# Patient Record
Sex: Female | Born: 1980 | Race: White | Hispanic: No | Marital: Married | Smoking: Never smoker
Health system: Southern US, Community
[De-identification: ages and names within clinical notes are randomized; demographics above are authoritative.]

## PROBLEM LIST (undated history)

## (undated) ENCOUNTER — Emergency Department: Discharge status: Absent without leave | Payer: Self-pay

## (undated) DIAGNOSIS — F418 Other specified anxiety disorders: Secondary | ICD-10-CM

## (undated) HISTORY — PX: WISDOM TOOTH EXTRACTION: SHX21

---
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
## 2000-02-26 ENCOUNTER — Other Ambulatory Visit: Admission: RE | Admit: 2000-02-26 | Discharge: 2000-02-26 | Payer: Self-pay | Admitting: Gynecology

## 2000-07-12 ENCOUNTER — Other Ambulatory Visit: Admission: RE | Admit: 2000-07-12 | Discharge: 2000-07-12 | Payer: Self-pay | Admitting: Gynecology

## 2000-10-28 ENCOUNTER — Other Ambulatory Visit: Admission: RE | Admit: 2000-10-28 | Discharge: 2000-10-28 | Payer: Self-pay | Admitting: Gynecology

## 2001-02-10 ENCOUNTER — Other Ambulatory Visit: Admission: RE | Admit: 2001-02-10 | Discharge: 2001-02-10 | Payer: Self-pay | Admitting: Gynecology

## 2002-04-21 ENCOUNTER — Other Ambulatory Visit: Admission: RE | Admit: 2002-04-21 | Discharge: 2002-04-21 | Payer: Self-pay | Admitting: *Deleted

## 2002-05-24 ENCOUNTER — Encounter: Admission: RE | Admit: 2002-05-24 | Discharge: 2002-08-22 | Payer: Self-pay | Admitting: Gynecology

## 2003-06-22 ENCOUNTER — Other Ambulatory Visit: Admission: RE | Admit: 2003-06-22 | Discharge: 2003-06-22 | Payer: Self-pay | Admitting: Gynecology

## 2012-11-23 ENCOUNTER — Encounter: Payer: Self-pay | Admitting: *Deleted

## 2012-11-23 ENCOUNTER — Emergency Department (INDEPENDENT_AMBULATORY_CARE_PROVIDER_SITE_OTHER)
Admission: EM | Admit: 2012-11-23 | Discharge: 2012-11-23 | Disposition: A | Discharge status: Home / Self Care | Payer: BC Managed Care – PPO | Source: Home / Self Care | Attending: Family Medicine | Admitting: Family Medicine

## 2012-11-23 DIAGNOSIS — R002 Palpitations: Secondary | ICD-10-CM

## 2012-11-23 DIAGNOSIS — Z3202 Encounter for pregnancy test, result negative: Secondary | ICD-10-CM

## 2012-11-23 LAB — POCT CBC W AUTO DIFF (K'VILLE URGENT CARE)

## 2012-11-23 MED ORDER — CLONAZEPAM 0.5 MG PO TABS: 0.5000 mg | ORAL_TABLET | Freq: Two times a day (BID) | ORAL | Status: DC | PRN

## 2012-11-23 NOTE — ED Notes (Signed)
Patient c/o intermittent palpitations, increasing in frequency x 4 days. Denies CP, SOB, nausea, diaphoresis, stress, anxiety, back or jaw pain. Palpitation typically occur when standing, talking and moving.

## 2012-11-23 NOTE — ED Provider Notes (Signed)
History     CSN: [id number]  Arrival date & time 11/23/12  1642   First MD Initiated Contact with Patient 11/23/12 1643      Chief Complaint  Patient presents with  . Palpitations    HPI Pt presents with palpitations x 4 days.  Pt states that she has had a prior history of palpitations in the past. However, palpitations have been more persistent over the past 4 days.  No associated CP or SOB.  No alleviating or aggravating factors associated with palpitations.  Pt denies any caffeine intake.  No prior hx/o anemia or thyroid problems.  Pt does report a prior hx/o anxiety, being treated with SSRI in the past.  Pt is currently not on medication for this.  Pt denies any stressors, but does report that her stress level has been stable.  Pt works as a Surveyor, mining.  Pt also reports a family hx/o cardiac issues in her father who had an early MI in his late 40s as well as a history of cardiac ablation.  Pt states that she was evaluated for this approx 10 years ago and had a negative workup.  History reviewed. No pertinent past medical history.  History reviewed. No pertinent past surgical history.  No family history on file.  History  Substance Use Topics  . Smoking status: Never Smoker   . Smokeless tobacco: Not on file  . Alcohol Use: No    OB History    Grav Para Term Preterm Abortions TAB SAB Ect Mult Living                  Review of Systems  All other systems reviewed and are negative.    Allergies  Keflex  Home Medications  No current outpatient prescriptions on file.  BP 124/72  Pulse 98  Temp 98.3 F (36.8 C) (Oral)  Resp 14  Ht 5\' 4"  (1.626 m)  Wt 155 lb (70.308 kg)  BMI 26.61 kg/m2  SpO2 100%  LMP 11/19/2012  Physical Exam  Constitutional: She appears well-developed and well-nourished.  HENT:  Head: Normocephalic and atraumatic.  Right Ear: External ear normal.  Left Ear: External ear normal.  Eyes: Conjunctivae normal are  normal. Pupils are equal, round, and reactive to light.  Neck: Normal range of motion. Neck supple.  Cardiovascular: Normal rate and regular rhythm.   Pulmonary/Chest: Effort normal and breath sounds normal.  Abdominal: Soft.  Musculoskeletal: Normal range of motion.  Neurological: She is alert.  Skin: Skin is warm.    ED Course  Procedures (including critical care time)   Labs Reviewed  POCT CBC W AUTO DIFF ([facility])  TSH  EKG: sinus rhythm with 1 ectopic ventricular beat.  No results found.   1. Heart palpitations       MDM  Clinically symptomatic at this time.  In further discussion with both pt and husband, I suspect there is a major element of anxiety in overall clinical picture. Pt became tearful when discussing this.  Will place pt on course of klonopin with plan for pt to follow up with cards given family history and ectopic ventricular beats on EKG.  Labwork also obtained including CBC (WNL) and TSH (pending).  Discussed CV red flags at length.  Follow up as needed.      The patient and/or caregiver has been counseled thoroughly with regard to treatment plan and/or medications prescribed including dosage, schedule, interactions, rationale for use, and possible side effects and they  verbalize understanding. Diagnoses and expected course of recovery discussed and will return if not improved as expected or if the condition worsens. Patient and/or caregiver verbalized understanding.             Doree Albee, MD 11/23/12 [phone]

## 2012-11-25 ENCOUNTER — Telehealth: Payer: Self-pay | Admitting: *Deleted

## 2013-08-02 ENCOUNTER — Emergency Department (INDEPENDENT_AMBULATORY_CARE_PROVIDER_SITE_OTHER)
Admission: EM | Admit: 2013-08-02 | Discharge: 2013-08-02 | Disposition: A | Discharge status: Home / Self Care | Payer: BC Managed Care – PPO | Source: Home / Self Care | Attending: Family Medicine | Admitting: Family Medicine

## 2013-08-02 ENCOUNTER — Encounter: Payer: Self-pay | Admitting: *Deleted

## 2013-08-02 DIAGNOSIS — S86899A Other injury of other muscle(s) and tendon(s) at lower leg level, unspecified leg, initial encounter: Secondary | ICD-10-CM

## 2013-08-02 DIAGNOSIS — D1779 Benign lipomatous neoplasm of other sites: Secondary | ICD-10-CM

## 2013-08-02 DIAGNOSIS — IMO0002 Reserved for concepts with insufficient information to code with codable children: Secondary | ICD-10-CM

## 2013-08-02 DIAGNOSIS — D1720 Benign lipomatous neoplasm of skin and subcutaneous tissue of unspecified limb: Secondary | ICD-10-CM

## 2013-08-02 HISTORY — DX: Other specified anxiety disorders: F41.8

## 2013-08-02 NOTE — ED Provider Notes (Signed)
  CSN: [id number]     Arrival date & time 08/02/13  1320 History     First MD Initiated Contact with Patient 08/02/13 1353     Chief Complaint  Patient presents with  . Mass on forearm       HPI Comments: Patient presents with two complaints: 1)  She felt a small nontender nodule on her right arm 2 days ago.  The lesion have been present longer. 2)  She complains of pain in her antero-lateral calves whenever she runs, especially on hills.  The pain improves afterwards.  The history is provided by the patient and a parent.    Past Medical History  Diagnosis Date  . Depression with anxiety    Past Surgical History  Procedure Laterality Date  . Wisdom tooth extraction     Family History  Problem Relation Age of Onset  . Heart disease Father    History  Substance Use Topics  . Smoking status: Never Smoker   . Smokeless tobacco: Never Used  . Alcohol Use: No   OB History   Grav Para Term Preterm Abortions TAB SAB Ect Mult Living                 Review of Systems  Musculoskeletal:       Bilateral lower leg pain when running  Skin: Negative for color change.  All other systems reviewed and are negative.    Allergies  Keflex  Home Medications   Current Outpatient Rx  Name  Route  Sig  Dispense  Refill  . escitalopram (LEXAPRO) 10 MG tablet   Oral   Take 10 mg by mouth daily.          BP 124/81  Pulse 95  Resp 14  Wt 160 lb (72.576 kg)  BMI 27.45 kg/m2  SpO2 98%  LMP 07/19/2013 Physical Exam  Nursing note and vitals reviewed. Constitutional: She is oriented to person, place, and time. She appears well-developed and well-nourished. No distress.  HENT:  Mouth/Throat: No oropharyngeal exudate.  Eyes: Conjunctivae are normal. Pupils are equal, round, and reactive to light.  Musculoskeletal: Normal range of motion. She exhibits no tenderness.       Right forearm: She exhibits no tenderness, no swelling and no edema.  Neurological: She is alert and oriented  to person, place, and time. She displays normal reflexes.  Skin: Skin is warm and dry. No rash noted.  Right lateral forearm has a 6mm dia nontender subcutaneous mobile nodule.  No erythema; no surface features Extremities:  No edema, swelling or tenderness.  Patient points to the anterior/medial aspect of each lower leg.   ED Course   Procedures  none    1. Lipoma of arm   2. Shin splints, initial encounter     MDM  Reassurance with respect to lipoma right arm.  No treatment necessary at this time.  If lesion becomes tender or increases significantly in size, followup with dermatologist. For shin splints, recommend stretching exercises ([facility] information and instruction handout given). Followup with [facility] if not improving about two weeks.   Lattie Haw, MD 08/02/13 [phone]

## 2013-08-02 NOTE — ED Notes (Signed)
Tracey Mora noticed a lump to her right forearm 2 days ago. Denies pain or injury to the site. No discoloration.

## 2016-03-12 ENCOUNTER — Encounter: Payer: Self-pay | Admitting: Emergency Medicine

## 2016-03-12 ENCOUNTER — Emergency Department (INDEPENDENT_AMBULATORY_CARE_PROVIDER_SITE_OTHER)
Admission: EM | Admit: 2016-03-12 | Discharge: 2016-03-12 | Disposition: A | Discharge status: Home / Self Care | Payer: PRIVATE HEALTH INSURANCE | Source: Home / Self Care | Attending: Family Medicine | Admitting: Family Medicine

## 2016-03-12 ENCOUNTER — Emergency Department (INDEPENDENT_AMBULATORY_CARE_PROVIDER_SITE_OTHER): Payer: PRIVATE HEALTH INSURANCE

## 2016-03-12 DIAGNOSIS — M79651 Pain in right thigh: Secondary | ICD-10-CM | POA: Diagnosis not present

## 2016-03-12 DIAGNOSIS — M79652 Pain in left thigh: Secondary | ICD-10-CM

## 2016-03-12 NOTE — ED Notes (Signed)
Right thigh pain x 1 week, denies trauma, "feels like a pulled muscle" 5/10, intermittent pain

## 2016-03-12 NOTE — ED Provider Notes (Signed)
CSN: [id number]     Arrival date & time 03/12/16  1412 History   First MD Initiated Contact with Patient 03/12/16 1522     Chief Complaint  Patient presents with  . Leg Pain      HPI Comments: Patient complains of intermittent vague pain in her right anterior thigh for one week.  The pain feels like a "pulled muscle," although she recalls no injury or change in physical activities.    Patient is a 35 y.o. female presenting with leg pain. The history is provided by the patient.  Leg Pain Location:  Leg Time since incident:  1 week Injury: no   Leg location:  L upper leg Pain details:    Quality:  Aching   Radiates to:  Does not radiate   Severity:  Mild   Onset quality:  Gradual   Timing:  Intermittent   Progression:  Unchanged Chronicity:  New Prior injury to area:  No Relieved by:  Nothing Worsened by:  Activity Ineffective treatments:  NSAIDs Associated symptoms: no back pain, no decreased ROM, no fatigue, no muscle weakness, no numbness, no stiffness, no swelling and no tingling     Past Medical History  Diagnosis Date  . Depression with anxiety    Past Surgical History  Procedure Laterality Date  . Wisdom tooth extraction     Family History  Problem Relation Age of Onset  . Heart disease Father    Social History  Substance Use Topics  . Smoking status: Never Smoker   . Smokeless tobacco: Never Used  . Alcohol Use: No   OB History    No data available     Review of Systems  Constitutional: Negative for fatigue.  Musculoskeletal: Negative for back pain and stiffness.  All other systems reviewed and are negative.   Allergies  Keflex  Home Medications   Prior to Admission medications   Medication Sig Start Date End Date Taking? Authorizing Provider  escitalopram (LEXAPRO) 10 MG tablet Take 10 mg by mouth daily.    Historical Provider, MD   Meds Ordered and Administered this Visit  Medications - No data to display  BP 124/81 mmHg  Pulse 88   Temp(Src) 98.2 F (36.8 C) (Oral)  Ht 5\' 4"  (1.626 m)  Wt 170 lb (77.111 kg)  BMI 29.17 kg/m2  SpO2 100%  LMP 02/12/2016 No data found.   Physical Exam  Constitutional: She is oriented to person, place, and time. She appears well-developed and well-nourished. No distress.  HENT:  Head: Normocephalic.  Eyes: Conjunctivae are normal. Pupils are equal, round, and reactive to light.  Cardiovascular: Normal heart sounds.   Pulmonary/Chest: Breath sounds normal.  Abdominal: There is no tenderness.  Musculoskeletal:       Right upper leg: Normal.       Legs: Area of pain as described by patient, but no tenderness to palpation   Neurological: She is alert and oriented to person, place, and time.  Skin: Skin is warm and dry. No rash noted.  Nursing note and vitals reviewed.   ED Course  Procedures none   Imaging Review Dg Femur, Min 2 Views Right  03/12/2016  CLINICAL DATA:  Intermittent pain medial right femur distally EXAM: RIGHT FEMUR 2 VIEWS COMPARISON:  None. FINDINGS: There is no evidence of fracture or other focal bone lesions. Soft tissues are unremarkable. IMPRESSION: Negative. Electronically Signed   By: Skipper Cliche M.D.   On: 03/12/2016 16:21      MDM  1. Acute thigh pain, left; ?etiology     Begin Mobic 15mg  daily. Begin quadriceps muscle stretching exercises. Followup with Dr. Aundria Mems or Dr. Lynne Leader ([facility]) if not improving about two weeks.     Kandra Nicolas, MD 03/19/16 [phone]

## 2016-03-12 NOTE — Discharge Instructions (Signed)
Begin Mobic 15mg  daily. Begin quadriceps muscle stretching exercises.

## 2017-09-30 ENCOUNTER — Encounter: Payer: Self-pay | Admitting: Emergency Medicine

## 2017-09-30 ENCOUNTER — Emergency Department (INDEPENDENT_AMBULATORY_CARE_PROVIDER_SITE_OTHER)
Admission: EM | Admit: 2017-09-30 | Discharge: 2017-09-30 | Disposition: A | Discharge status: Home / Self Care | Payer: BLUE CROSS/BLUE SHIELD | Source: Home / Self Care

## 2017-09-30 DIAGNOSIS — H6692 Otitis media, unspecified, left ear: Secondary | ICD-10-CM | POA: Diagnosis not present

## 2017-09-30 MED ORDER — AMOXICILLIN 500 MG PO CAPS: 500.0000 mg | ORAL_CAPSULE | Freq: Three times a day (TID) | ORAL | 0 refills | Status: DC

## 2017-09-30 NOTE — ED Triage Notes (Signed)
Patient reports sense of fullness in left ear since yesterday with very annoying tinitis. Denies actual pain.

## 2017-09-30 NOTE — Discharge Instructions (Signed)
See your Physician for recheck next week  °

## 2017-09-30 NOTE — ED Provider Notes (Signed)
[facility]    CSN: [id number] Arrival date & time: 09/30/17  1655     History   Chief Complaint Chief Complaint  Patient presents with  . Ear Problem    left    HPI Tracey Mora is a 36 y.o. female.   The history is provided by the patient. No language interpreter was used.  Otalgia  Location:  Left Behind ear:  No abnormality Quality:  Aching Severity:  Moderate Onset quality:  Gradual Timing:  Constant Progression:  Worsening Chronicity:  New Context: not recent URI   Relieved by:  Nothing Worsened by:  Nothing Ineffective treatments:  None tried Associated symptoms: tinnitus   Risk factors: no chronic ear infection   Pt complains of ringing in left ear,  Pain reports fullness and discomfort  Past Medical History:  Diagnosis Date  . Depression with anxiety     There are no active problems to display for this patient.   Past Surgical History:  Procedure Laterality Date  . WISDOM TOOTH EXTRACTION      OB History    No data available       Home Medications    Prior to Admission medications   Medication Sig Start Date End Date Taking? Authorizing Provider  amoxicillin (AMOXIL) 500 MG capsule Take 1 capsule (500 mg total) by mouth 3 (three) times daily. 09/30/17   Fransico Meadow, PA-C  escitalopram (LEXAPRO) 10 MG tablet Take 10 mg by mouth daily.    [provider]    Family History Family History  Problem Relation Age of Onset  . Heart disease Father     Social History Social History  Substance Use Topics  . Smoking status: Never Smoker  . Smokeless tobacco: Never Used  . Alcohol use No     Allergies   Keflex [cephalexin]   Review of Systems Review of Systems  HENT: Positive for ear pain and tinnitus.   All other systems reviewed and are negative.    Physical Exam Triage Vital Signs ED Triage Vitals  Enc Vitals Group     BP 09/30/17 1715 116/77     Pulse Rate 09/30/17 1715 79     Resp  09/30/17 1715 16     Temp 09/30/17 1715 98.8 F (37.1 C)     Temp Source 09/30/17 1715 Oral     SpO2 09/30/17 1715 99 %     Weight 09/30/17 1716 175 lb (79.4 kg)     Height 09/30/17 1716 5\' 4"  (1.626 m)     Head Circumference --      Peak Flow --      Pain Score --      Pain Loc --      Pain Edu? --      Excl. in [place]? --    No data found.   Updated Vital Signs BP 116/77 (BP Location: Left Arm)   Pulse 79   Temp 98.8 F (37.1 C) (Oral)   Resp 16   Ht 5\' 4"  (1.626 m)   Wt 175 lb (79.4 kg)   LMP 09/10/2017 (Exact Date)   SpO2 99%   BMI 30.04 kg/m   Visual Acuity Right Eye Distance:   Left Eye Distance:   Bilateral Distance:    Right Eye Near:   Left Eye Near:    Bilateral Near:     Physical Exam  Constitutional: She appears well-developed and well-nourished.  HENT:  Head: Normocephalic.  Right tm fluid dull landmarks.  Left tm  Landmarks obscurred, no erythema,  Slight bulging  Eyes: Pupils are equal, round, and reactive to light.  Neck: Normal range of motion.  Cardiovascular: Normal rate.   Pulmonary/Chest: Effort normal.  Musculoskeletal: Normal range of motion.  Neurological: She is alert.  Skin: Skin is warm.  Psychiatric: She has a normal mood and affect.  Nursing note and vitals reviewed.    UC Treatments / Results  Labs (all labs ordered are listed, but only abnormal results are displayed) Labs Reviewed - No data to display  EKG  EKG Interpretation None       Radiology No results found.  Procedures Procedures (including critical care time)  Medications Ordered in UC Medications - No data to display   Initial Impression / Assessment and Plan / UC Course  I have reviewed the triage vital signs and the nursing notes.  Pertinent labs & imaging results that were available during my care of the patient were reviewed by me and considered in my medical decision making (see chart for details).     Pt advised decongestant. Rx for  amoxicillin.   I advised recheck with primary on Monday.    Final Clinical Impressions(s) / UC Diagnoses   Final diagnoses:  Left otitis media, unspecified otitis media type    New Prescriptions New Prescriptions   AMOXICILLIN (AMOXIL) 500 MG CAPSULE    Take 1 capsule (500 mg total) by mouth 3 (three) times daily.     Controlled Substance Prescriptions [place] Controlled Substance Registry consulted? Not Applicable  An After Visit Summary was printed and given to the patient.   Fransico Meadow, [place] 09/30/17 1736

## 2018-02-24 IMAGING — CR DG FEMUR 2+V*R*
4 series · 4 of 4 positions shown · non-contrast
Comparison: None.

CLINICAL DATA: Intermittent pain medial right femur distally

EXAM:
RIGHT FEMUR 2 VIEWS

[femur ap (1 of 2)]
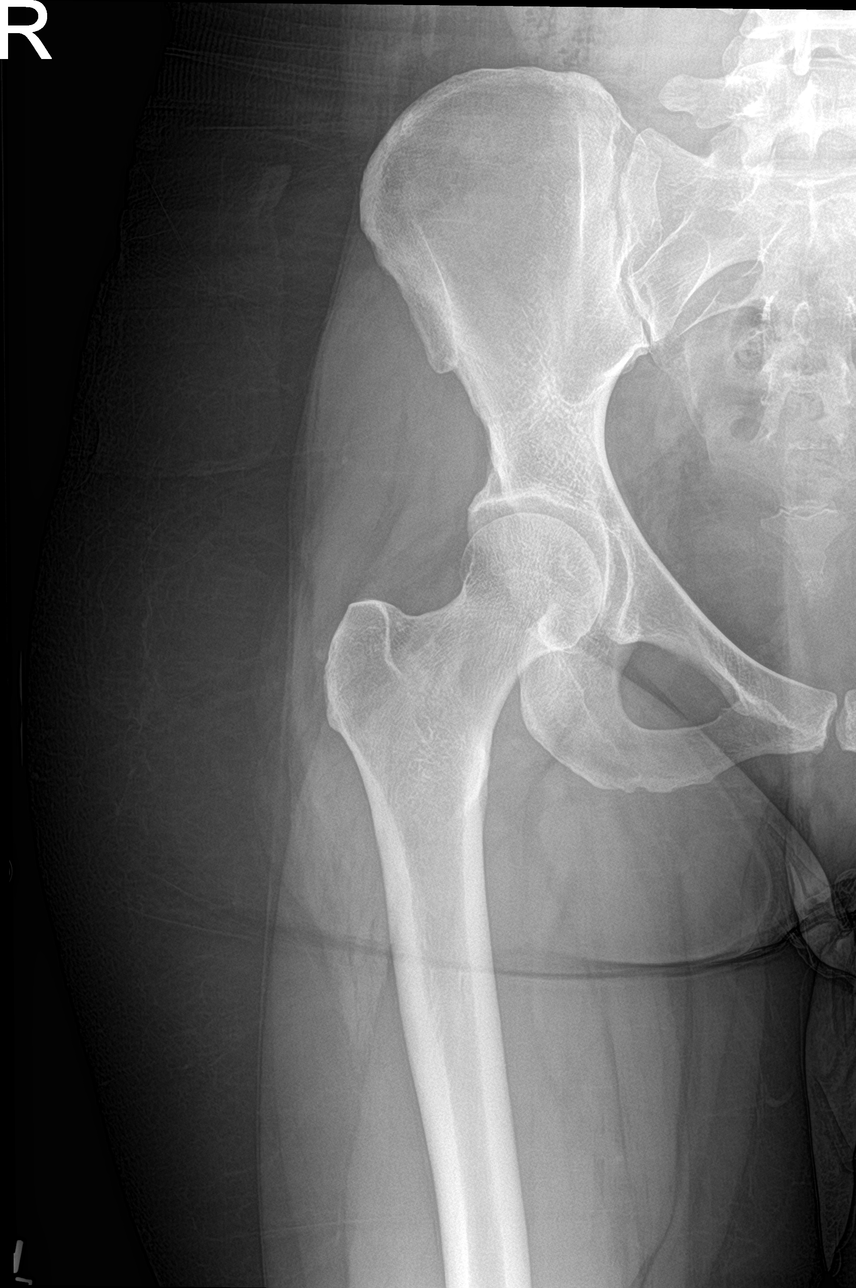

[femur ap (2 of 2)]
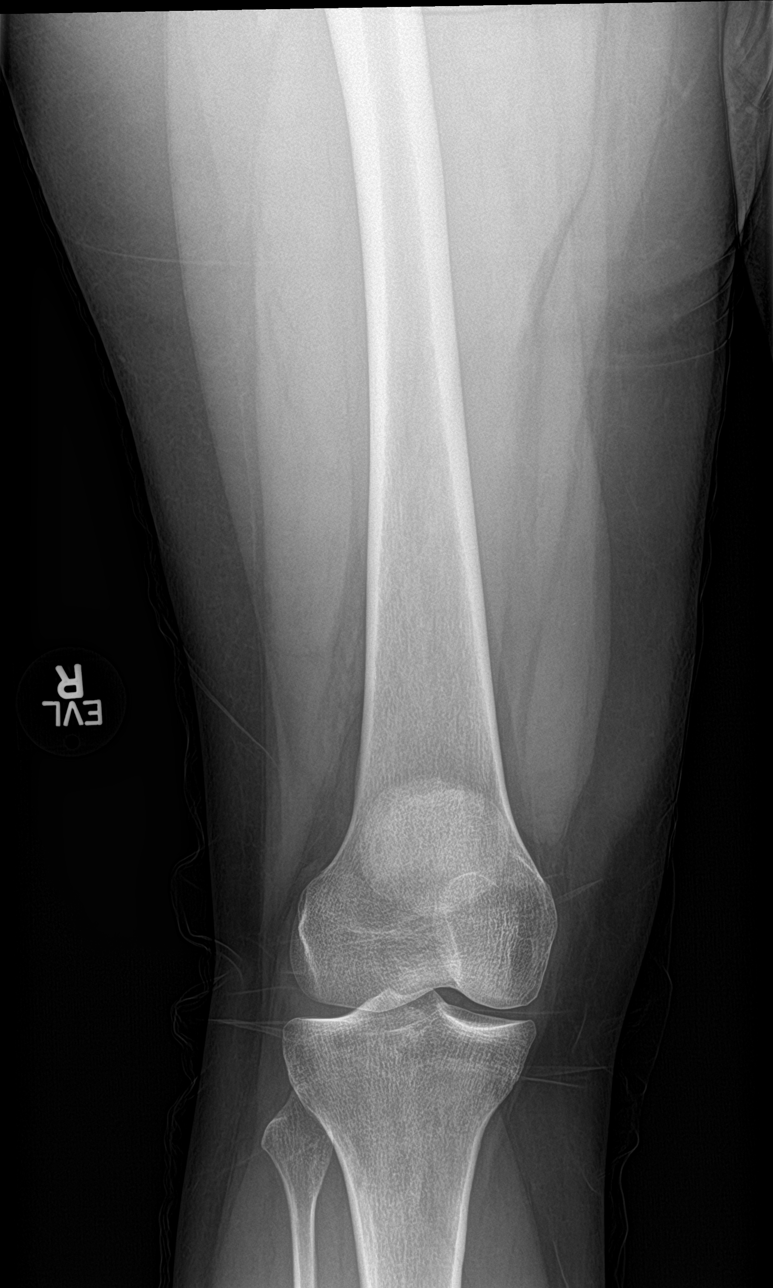

[femur lat (1 of 2)]
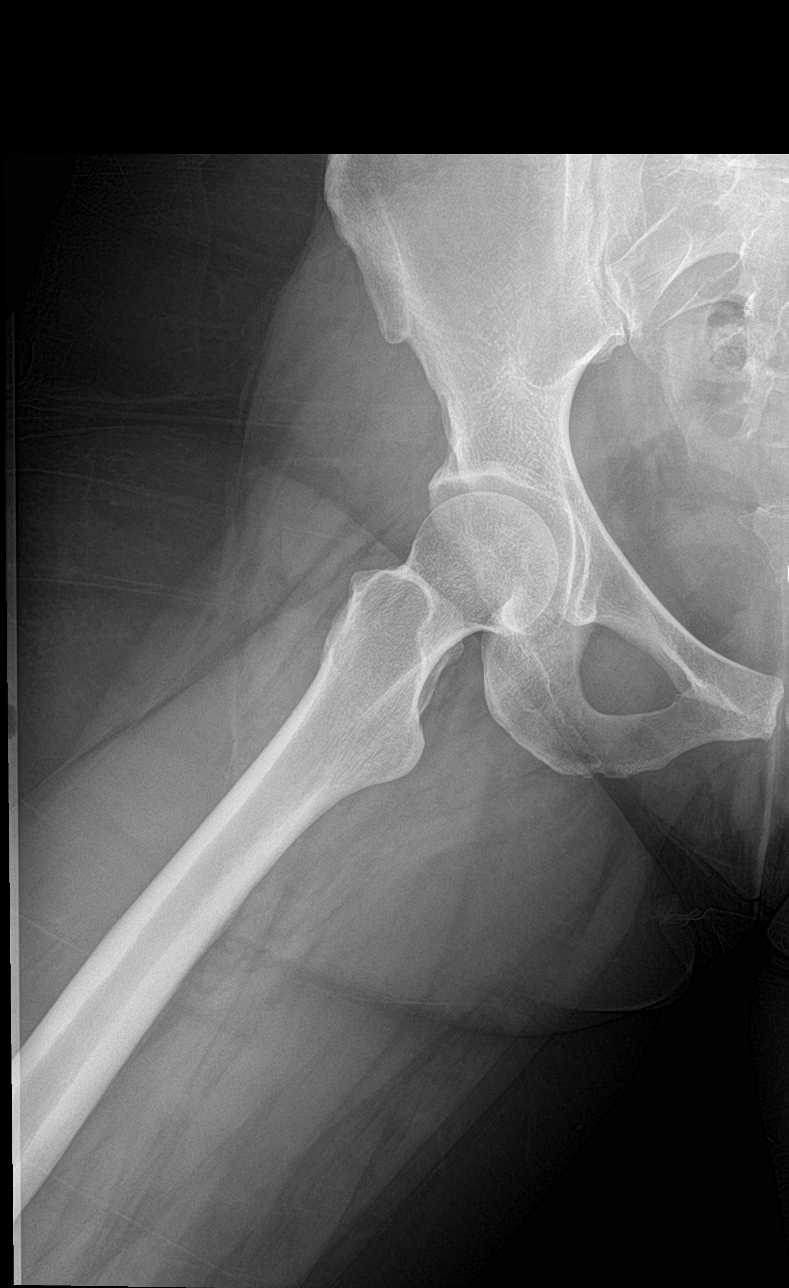

[femur lat (2 of 2)]
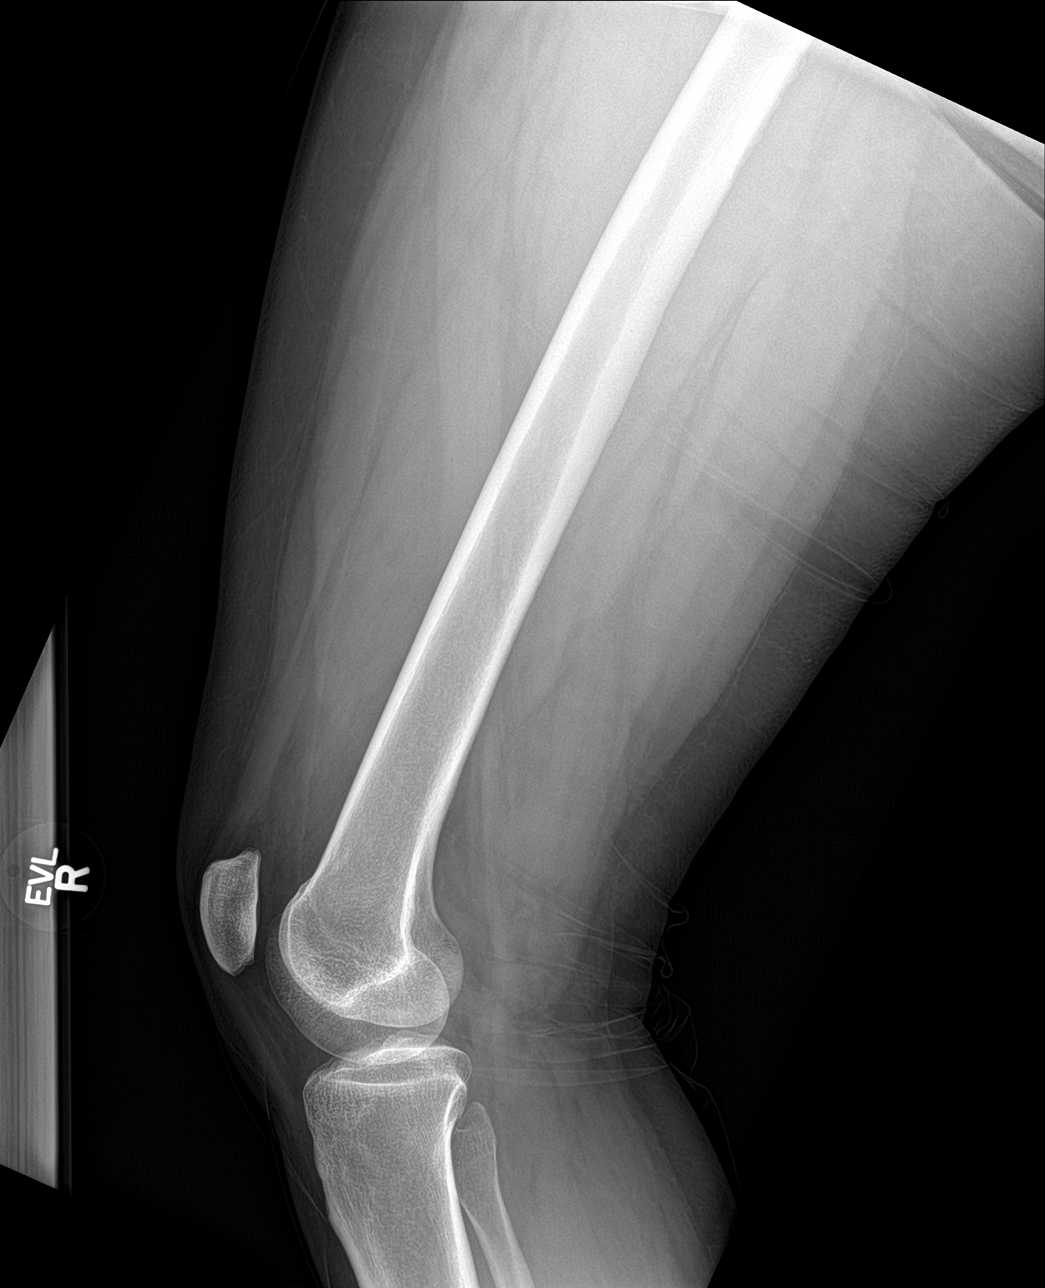

[4 of 4 positions shown; findings below may reference images not displayed]

FINDINGS: There is no evidence of fracture or other focal bone lesions. Soft
tissues are unremarkable.
IMPRESSION: Negative.

## 2020-02-10 ENCOUNTER — Other Ambulatory Visit: Payer: Self-pay

## 2020-02-10 ENCOUNTER — Emergency Department (INDEPENDENT_AMBULATORY_CARE_PROVIDER_SITE_OTHER)
Admission: EM | Admit: 2020-02-10 | Discharge: 2020-02-10 | Disposition: A | Discharge status: Home / Self Care | Payer: BC Managed Care – PPO | Source: Home / Self Care

## 2020-02-10 ENCOUNTER — Encounter: Payer: Self-pay | Admitting: Emergency Medicine

## 2020-02-10 ENCOUNTER — Emergency Department (INDEPENDENT_AMBULATORY_CARE_PROVIDER_SITE_OTHER): Payer: BC Managed Care – PPO

## 2020-02-10 DIAGNOSIS — R0602 Shortness of breath: Secondary | ICD-10-CM | POA: Diagnosis not present

## 2020-02-10 DIAGNOSIS — R000 Tachycardia, unspecified: Secondary | ICD-10-CM

## 2020-02-10 LAB — POCT CBC W AUTO DIFF (K'VILLE URGENT CARE)

## 2020-02-10 LAB — POCT URINALYSIS DIP (MANUAL ENTRY)
Bilirubin, UA: NEGATIVE
Blood, UA: NEGATIVE
Glucose, UA: NEGATIVE mg/dL
Ketones, POC UA: NEGATIVE mg/dL
Leukocytes, UA: NEGATIVE
Nitrite, UA: NEGATIVE
Spec Grav, UA: 1.025 (ref 1.010–1.025)
Urobilinogen, UA: 0.2 E.U./dL
pH, UA: 7 (ref 5.0–8.0)

## 2020-02-10 LAB — POCT URINE PREGNANCY: Preg Test, Ur: NEGATIVE

## 2020-02-10 MED ORDER — SODIUM CHLORIDE 0.9 % IV SOLN
10.00 | INTRAVENOUS | Status: DC
Start: ? — End: 2020-02-10

## 2020-02-10 MED ORDER — GENERIC EXTERNAL MEDICATION
Status: DC
Start: ? — End: 2020-02-10

## 2020-02-10 NOTE — ED Provider Notes (Signed)
[facility]    CSN: [id number] Arrival date & time: 02/10/20  1214      History   Chief Complaint Chief Complaint  Patient presents with  . Tachycardia    HPI Tracey Mora is a 39 y.o. female.   HPI Patient presents today with 24-hour onset of accelerated heart rate.  On arrival her heart rate is 135 with EKG is 120 sinus tachycardia sustained.  Patient has no prior history of sinus tachycardia.  She does have a family medical history including a father who has had a heart attack less than 63 and a brother who is also had a heart attack at less than age of 37.  Mother has no known cardiac events.  Patient reports that she is sedentary at home for work however has not had any recent extended prolonged immobility or extended travel.  No history of prior PEs or history of cancer.  She is eating and drinking okay.  Has some mild fatigue since development of symptoms.  No history of thyroid abnormalities.  Blood pressure is elevated today however typically runs less than 120/80. Past Medical History:  Diagnosis Date  . Depression with anxiety     There are no problems to display for this patient.   Past Surgical History:  Procedure Laterality Date  . WISDOM TOOTH EXTRACTION      OB History   No obstetric history on file.      Home Medications    Prior to Admission medications   Medication Sig Start Date End Date Taking? Authorizing Provider  amoxicillin (AMOXIL) 500 MG capsule Take 1 capsule (500 mg total) by mouth 3 (three) times daily. 09/30/17   Fransico Meadow, PA-C  escitalopram (LEXAPRO) 10 MG tablet Take 10 mg by mouth daily.    [provider]    Family History Family History  Problem Relation Age of Onset  . Heart disease Father     Social History Social History   Tobacco Use  . Smoking status: Never Smoker  . Smokeless tobacco: Never Used  Substance Use Topics  . Alcohol use: No  . Drug use: No     Allergies     Keflex [cephalexin]   Review of Systems Review of Systems Pertinent negatives listed in HPI  Physical Exam Triage Vital Signs ED Triage Vitals  Enc Vitals Group     BP      Pulse      Resp      Temp      Temp src      SpO2      Weight      Height      Head Circumference      Peak Flow      Pain Score      Pain Loc      Pain Edu?      Excl. in [place]?    No data found.  Updated Vital Signs BP (!) 148/84 (BP Location: Left Arm)   Pulse (!) 132 Comment: reports feeling anxious  Temp 98.5 F (36.9 C) (Oral)   Ht 5\' 4"  (1.626 m)   Wt 181 lb (82.1 kg)   LMP 01/27/2020   SpO2 98%   BMI 31.07 kg/m   Visual Acuity Right Eye Distance:   Left Eye Distance:   Bilateral Distance:    Right Eye Near:   Left Eye Near:    Bilateral Near:     Physical Exam Constitutional:  Appearance: She is not ill-appearing or toxic-appearing.     Comments: Appears Anxious   HENT:     Head: Normocephalic.  Cardiovascular:     Rate and Rhythm: Regular rhythm. Tachycardia present.     Pulses: Normal pulses.  Pulmonary:     Effort: Pulmonary effort is normal. No respiratory distress.     Breath sounds: Normal breath sounds. No wheezing or rhonchi.  Abdominal:     General: Abdomen is flat. Bowel sounds are normal.     Palpations: Abdomen is soft.  Musculoskeletal:        General: Normal range of motion.     Cervical back: Normal range of motion. No rigidity.  Lymphadenopathy:     Cervical: No cervical adenopathy.  Skin:    General: Skin is warm.  Neurological:     Mental Status: She is oriented to person, place, and time.  Psychiatric:        Mood and Affect: Mood is anxious.      UC Treatments / Results  Labs (all labs ordered are listed, but only abnormal results are displayed) Labs Reviewed - No data to display  EKG Sinus Tachycardia, 120  BPM, Left Atrial enlargement, non-specific T wave abnormalities   No acute STEMI or ischemia present of ECG  Radiology DG  Chest 2 View  Result Date: 02/10/2020 CLINICAL DATA:  Tachycardia and shortness of breath EXAM: CHEST - 2 VIEW COMPARISON:  None. FINDINGS: Normal heart size and mediastinal contours. No acute infiltrate or edema. No effusion or pneumothorax. No acute osseous findings. IMPRESSION: No active cardiopulmonary disease. Electronically Signed   By: Monte Fantasia M.D.   On: 02/10/2020 13:22    Procedures Procedures (including critical care time)  Medications Ordered in UC Medications - No data to display  Initial Impression / Assessment and Plan / UC Course  I have reviewed the triage vital signs and the nursing notes.  Pertinent labs & imaging results that were available during my care of the patient were reviewed by me and considered in my medical decision making (see chart for details).     Sinus tachycardia,  of unknown etiology HR sustained greater than 110 x 24 hours. Initial work up here shows normal CBC with diff CXR normal, physical normal overall normal with the exception of an accelerated HR with subjective SOB. Discussed with patient and finally convinced her to seek further evaluation at the ER. Final Clinical Impressions(s) / UC Diagnoses   Final diagnoses:  Sinus tachycardia     Discharge Instructions     Recommend follow-up evaluation in the setting of the emergency department to identify cause of accelerated heart rate.  Your chest x-ray was normal today.  Your blood work shows no evidence of infection in your urine results were normal.  Given that you are symptomatic with the accelerated heart rate I do recommend follow-up in the setting of the ER for additional cardiac studies, thyroid study, and a CT of the chest to rule out embolism.  Go immediately to either [facility] here in [place] or [place] on [address] for additional evaluation of symptoms today.     ED Prescriptions    None     PDMP not  reviewed this encounter.   Scot Jun, FNP 02/11/20 [id number]

## 2020-02-10 NOTE — Discharge Instructions (Addendum)
Recommend follow-up evaluation in the setting of the emergency department to identify cause of accelerated heart rate.  Your chest x-ray was normal today.  Your blood work shows no evidence of infection in your urine results were normal.  Given that you are symptomatic with the accelerated heart rate I do recommend follow-up in the setting of the ER for additional cardiac studies, thyroid study, and a CT of the chest to rule out embolism.  Go immediately to either [facility] here in [place] or [place] on [address] for additional evaluation of symptoms today.

## 2020-02-10 NOTE — ED Triage Notes (Signed)
Here with sudden heart palpitation that started yesterday around 1pm. Constant with slight SOB. Denies dizziness, chest pain. Hx Anxiety. Took one tablet of Clonopin with some relief.

## 2020-02-18 ENCOUNTER — Other Ambulatory Visit: Payer: Self-pay

## 2020-02-18 ENCOUNTER — Encounter (HOSPITAL_BASED_OUTPATIENT_CLINIC_OR_DEPARTMENT_OTHER): Payer: Self-pay | Admitting: Emergency Medicine

## 2020-02-18 ENCOUNTER — Emergency Department (HOSPITAL_BASED_OUTPATIENT_CLINIC_OR_DEPARTMENT_OTHER)
Admission: EM | Admit: 2020-02-18 | Discharge: 2020-02-18 | Disposition: A | Discharge status: Home / Self Care | Payer: BC Managed Care – PPO | Attending: Emergency Medicine | Admitting: Emergency Medicine

## 2020-02-18 ENCOUNTER — Emergency Department (HOSPITAL_BASED_OUTPATIENT_CLINIC_OR_DEPARTMENT_OTHER): Payer: BC Managed Care – PPO

## 2020-02-18 DIAGNOSIS — R002 Palpitations: Secondary | ICD-10-CM | POA: Diagnosis not present

## 2020-02-18 DIAGNOSIS — Z881 Allergy status to other antibiotic agents status: Secondary | ICD-10-CM | POA: Diagnosis not present

## 2020-02-18 DIAGNOSIS — R0602 Shortness of breath: Secondary | ICD-10-CM | POA: Diagnosis not present

## 2020-02-18 DIAGNOSIS — F419 Anxiety disorder, unspecified: Secondary | ICD-10-CM | POA: Diagnosis not present

## 2020-02-18 DIAGNOSIS — R000 Tachycardia, unspecified: Secondary | ICD-10-CM | POA: Diagnosis present

## 2020-02-18 LAB — PREGNANCY, URINE: Preg Test, Ur: NEGATIVE

## 2020-02-18 LAB — COMPREHENSIVE METABOLIC PANEL
ALT: 19 U/L (ref 0–44)
AST: 15 U/L (ref 15–41)
Albumin: 4.1 g/dL (ref 3.5–5.0)
Alkaline Phosphatase: 76 U/L (ref 38–126)
Anion gap: 10 (ref 5–15)
BUN: 13 mg/dL (ref 6–20)
CO2: 21 mmol/L — ABNORMAL LOW (ref 22–32)
Calcium: 9.2 mg/dL (ref 8.9–10.3)
Chloride: 105 mmol/L (ref 98–111)
Creatinine, Ser: 0.84 mg/dL (ref 0.44–1.00)
GFR calc Af Amer: >60 mL/min (ref >60)
GFR calc non Af Amer: >60 mL/min (ref >60)
Glucose, Bld: 97 mg/dL (ref 70–99)
Potassium: 3.9 mmol/L (ref 3.5–5.1)
Sodium: 136 mmol/L (ref 135–145)
Total Bilirubin: 0.9 mg/dL (ref 0.3–1.2)
Total Protein: 7.7 g/dL (ref 6.5–8.1)

## 2020-02-18 LAB — TROPONIN I (HIGH SENSITIVITY): Troponin I (High Sensitivity): <2 ng/L (ref <18)

## 2020-02-18 LAB — CBC WITH DIFFERENTIAL/PLATELET
Abs Immature Granulocytes: 0.03 10*3/uL (ref 0.00–0.07)
Basophils Absolute: 0 10*3/uL (ref 0.0–0.1)
Basophils Relative: 0 %
Eosinophils Absolute: 0 10*3/uL (ref 0.0–0.5)
Eosinophils Relative: 0 %
HCT: 45.4 % (ref 36.0–46.0)
Hemoglobin: 15.4 g/dL — ABNORMAL HIGH (ref 12.0–15.0)
Immature Granulocytes: 0 %
Lymphocytes Relative: 31 %
Lymphs Abs: 2.9 10*3/uL (ref 0.7–4.0)
MCH: 27.8 pg (ref 26.0–34.0)
MCHC: 33.9 g/dL (ref 30.0–36.0)
MCV: 81.9 fL (ref 80.0–100.0)
Monocytes Absolute: 0.8 10*3/uL (ref 0.1–1.0)
Monocytes Relative: 8 %
Neutro Abs: 5.6 10*3/uL (ref 1.7–7.7)
Neutrophils Relative %: 61 %
Platelets: 295 10*3/uL (ref 150–400)
RBC: 5.54 MIL/uL — ABNORMAL HIGH (ref 3.87–5.11)
RDW: 12.6 % (ref 11.5–15.5)
WBC: 9.4 10*3/uL (ref 4.0–10.5)
nRBC: 0 % (ref 0.0–0.2)

## 2020-02-18 MED ORDER — CLONAZEPAM 0.5 MG PO TABS
0.5000 mg | ORAL_TABLET | Freq: Once | ORAL | Status: DC
  Filled 2020-02-18: qty 1

## 2020-02-18 MED ORDER — LORAZEPAM 1 MG PO TABS
1.0000 mg | ORAL_TABLET | Freq: Once | ORAL | Status: AC
  Administered 2020-02-18: 21:00:00 1 mg via ORAL
  Filled 2020-02-18: qty 1

## 2020-02-18 MED ORDER — GENERIC EXTERNAL MEDICATION
Status: DC
Start: ? — End: 2020-02-18

## 2020-02-18 MED ORDER — SODIUM CHLORIDE 0.9 % IV SOLN
10.00 | INTRAVENOUS | Status: DC
Start: ? — End: 2020-02-18

## 2020-02-18 NOTE — Discharge Instructions (Signed)
You were seen in the emergency department today with elevated heart rate.  Please follow closely with your cardiologist and primary care doctor to guide further evaluation.  Return to the emergency department any new or suddenly worsening symptoms after taking your medications at home as prescribed.

## 2020-02-18 NOTE — ED Provider Notes (Signed)
Emergency Department Provider Note   I have reviewed the triage vital signs and the nursing notes.   HISTORY  Chief Complaint Anxiety and Tachycardia   HPI Tracey Mora is a 39 y.o. female with PMH of anxiety presents to the ED with return of heart palpitations. This has been a constant problem for the patient over the last several weeks. She reports waking in the AM with heart palpitations and SOB. She took her Lexapro and Klonopin this AM with no improvement. She did not develop chest pain. No fever. No chills. She has seen Cardiology and is wearing an ambulatory monitor. She is scheduled for an ECHO as an outpatient and is working with her PCP regarding her anxiety medications. She tells me that with persistent symptoms she needed to return to the ED for fear that something was being missed. She was seen in the ED yesterday ([facility]) and at that time had full ED w/u including CXR, labs, TSH, and CTA chest without acute findings.   Past Medical History:  Diagnosis Date  . Depression with anxiety     There are no problems to display for this patient.   Past Surgical History:  Procedure Laterality Date  . WISDOM TOOTH EXTRACTION      Allergies Keflex [cephalexin]  Family History  Problem Relation Age of Onset  . Heart disease Father     Social History Social History   Tobacco Use  . Smoking status: Never Smoker  . Smokeless tobacco: Never Used  Substance Use Topics  . Alcohol use: No  . Drug use: No    Review of Systems  Constitutional: No fever/chills Eyes: No visual changes. ENT: No sore throat. Cardiovascular: Denies chest pain. Positive palpitations.  Respiratory: Positive shortness of breath. Gastrointestinal: No abdominal pain.  No nausea, no vomiting.  No diarrhea.  No constipation. Genitourinary: Negative for dysuria. Musculoskeletal: Negative for back pain. Skin: Negative for rash. Neurological: Negative for headaches, focal weakness or  numbness.  10-point ROS otherwise negative.  ____________________________________________   PHYSICAL EXAM:  VITAL SIGNS: ED Triage Vitals  Enc Vitals Group     BP 02/18/20 1945 (!) 138/94     Pulse Rate 02/18/20 1945 (!) 102     Resp 02/18/20 1945 16     SpO2 02/18/20 1945 100 %     Weight 02/18/20 1941 172 lb (78 kg)     Height 02/18/20 1941 5\' 4"  (1.626 m)   Constitutional: Alert and oriented. Patient is tearful and breathing rapidly.  Eyes: Conjunctivae are normal.  Head: Atraumatic. Nose: No congestion/rhinnorhea. Mouth/Throat: Mucous membranes are moist.  Neck: No stridor.   Cardiovascular: Sinus tachycardia. Good peripheral circulation. Grossly normal heart sounds.   Respiratory: Normal respiratory effort.  No retractions. Lungs CTAB. Gastrointestinal: Soft and nontender. No distention.  Musculoskeletal: No lower extremity tenderness nor edema. No gross deformities of extremities. Neurologic:  Normal speech and language.   Skin:  Skin is warm, dry and intact. No rash noted.  ____________________________________________   LABS (all labs ordered are listed, but only abnormal results are displayed)  Labs Reviewed  COMPREHENSIVE METABOLIC PANEL - Abnormal; Notable for the following components:      Result Value   CO2 21 (*)    All other components within normal limits  CBC WITH DIFFERENTIAL/PLATELET - Abnormal; Notable for the following components:   RBC 5.54 (*)    Hemoglobin 15.4 (*)    All other components within normal limits  PREGNANCY, URINE  TROPONIN I (  HIGH SENSITIVITY)   ____________________________________________  EKG   EKG Interpretation  Date/Time:  Sunday February 18 2020 19:43:38 EST Ventricular Rate:  107 PR Interval:    QRS Duration: 65 QT Interval:  395 QTC Calculation: 527 R Axis:   75 Text Interpretation: Sinus tachycardia Borderline repol abnormality, diffuse leads Prolonged QT interval No STEMI Confirmed by Nanda Quinton [phone]) on  02/18/2020 8:28:12 PM       ____________________________________________  RADIOLOGY  DG Chest Portable 1 View  Result Date: 02/18/2020 CLINICAL DATA:  39 year old female with tachycardia. EXAM: PORTABLE CHEST 1 VIEW COMPARISON:  Chest radiograph dated 02/10/2020. FINDINGS: The heart size and mediastinal contours are within normal limits. Both lungs are clear. The visualized skeletal structures are unremarkable. IMPRESSION: No active disease. Electronically Signed   By: Anner Crete M.D.   On: 02/18/2020 20:31    ____________________________________________   PROCEDURES  Procedure(s) performed:   Procedures  None ____________________________________________   INITIAL IMPRESSION / ASSESSMENT AND PLAN / ED COURSE  Pertinent labs & imaging results that were available during my care of the patient were reviewed by me and considered in my medical decision making (see chart for details).   Patient returns to the ED with palpitations and SOB. Reviewed recent ED w/u and discussed with patient Cardiology follow up plan and pending tests. After [facility] discussion regarding her prior test results and repeat labs here the patient is feeling improved. HR normalizing. Will discuss with PCP mgmt of anxiety medications while simultaneously working with Cardiology to r/o underlying cardiac etiology. No ectomy of telemetry in the ED.   Discussed ED return precautions in detail. Patient comfortable with the plan at discharge.   I reviewed all nursing notes, vitals, pertinent old records, EKGs, labs, imaging (as available).    ____________________________________________  FINAL CLINICAL IMPRESSION(S) / ED DIAGNOSES  Final diagnoses:  Palpitations     MEDICATIONS GIVEN DURING THIS VISIT:  Medications  LORazepam (ATIVAN) tablet 1 mg (1 mg Oral Given 02/18/20 2034)     Note:  This document was prepared using Dragon voice recognition software and may include unintentional dictation  errors.  Nanda Quinton, MD, [facility]    Honesty Menta, [facility], MD 02/19/20 [phone]

## 2020-02-18 NOTE — ED Triage Notes (Signed)
Reports her heart started pounding last Friday.  Notably anxious in triage.  Has a cardiac monitor (halter) on currently.  Takes klonopin as needed.  Last dose this morning.

## 2020-08-09 ENCOUNTER — Other Ambulatory Visit: Payer: Self-pay

## 2020-08-09 ENCOUNTER — Emergency Department: Admit: 2020-08-09 | Discharge status: Absent without leave | Payer: Self-pay

## 2020-08-09 ENCOUNTER — Emergency Department (INDEPENDENT_AMBULATORY_CARE_PROVIDER_SITE_OTHER)
Admission: EM | Admit: 2020-08-09 | Discharge: 2020-08-09 | Disposition: A | Discharge status: Home / Self Care | Payer: BC Managed Care – PPO | Source: Home / Self Care | Attending: Family Medicine | Admitting: Family Medicine

## 2020-08-09 DIAGNOSIS — H8111 Benign paroxysmal vertigo, right ear: Secondary | ICD-10-CM | POA: Diagnosis not present

## 2020-08-09 MED ORDER — MECLIZINE HCL 25 MG PO TABS: 25.0000 mg | ORAL_TABLET | Freq: Three times a day (TID) | ORAL | 1 refills | Status: AC | PRN

## 2020-08-09 NOTE — ED Provider Notes (Signed)
[facility]    CSN: [id number] Arrival date & time: 08/09/20  1255      History   Chief Complaint Chief Complaint  Patient presents with  . Dizziness  . Anxiety    HPI Tracey Mora is a 39 y.o. female.   Patient reports that she woke up yesterday feeling dizzy (head spinning).  Last night she had chills, and felt some pressure in her right ear.  She denies nausea/vomiting, and feels well otherwise.  She has had no improvement with Dramamine and Benadryl.   The history is provided by the patient.  Dizziness Quality:  Head spinning, imbalance and room spinning Severity:  Mild Onset quality:  Sudden Duration:  1 day Timing:  Constant Progression:  Unchanged Chronicity:  New Context: bending over and head movement   Relieved by:  Being still Worsened by:  Movement Ineffective treatments:  None tried Associated symptoms: no headaches, no hearing loss, no nausea, no syncope, no tinnitus, no vision changes and no vomiting   Anxiety Pertinent negatives include no headaches.    Past Medical History:  Diagnosis Date  . Depression with anxiety     There are no problems to display for this patient.   Past Surgical History:  Procedure Laterality Date  . WISDOM TOOTH EXTRACTION      OB History   No obstetric history on file.      Home Medications    Prior to Admission medications   Medication Sig Start Date End Date Taking? Authorizing Provider  clonazePAM (KLONOPIN) 1 MG tablet Take 1 mg by mouth 2 (two) times daily as needed for anxiety. Took 1/2 today    [provider]  escitalopram (LEXAPRO) 10 MG tablet Take 20 mg by mouth daily.     [provider]  meclizine (ANTIVERT) 25 MG tablet Take 1 tablet (25 mg total) by mouth 3 (three) times daily as needed for dizziness. 08/09/20   Kandra Nicolas, MD  metoprolol tartrate (LOPRESSOR) 25 MG tablet Take 25 mg by mouth 2 (two) times daily.    [provider]     Family History Family History  Problem Relation Age of Onset  . Heart disease Father     Social History Social History   Tobacco Use  . Smoking status: Never Smoker  . Smokeless tobacco: Never Used  Vaping Use  . Vaping Use: Never used  Substance Use Topics  . Alcohol use: No  . Drug use: No     Allergies   Keflex [cephalexin]   Review of Systems Review of Systems  Constitutional: Positive for activity change. Negative for appetite change, chills, diaphoresis, fatigue and fever.  HENT: Negative for congestion, ear pain, hearing loss, sinus pain and tinnitus.   Eyes: Negative.   Respiratory: Negative.   Cardiovascular: Negative.  Negative for syncope.  Gastrointestinal: Negative.  Negative for nausea and vomiting.  Genitourinary: Negative.   Musculoskeletal: Negative.   Skin: Negative.   Neurological: Positive for dizziness. Negative for headaches.     Physical Exam Triage Vital Signs ED Triage Vitals  Enc Vitals Group     BP 08/09/20 1301 (!) 140/98     Pulse Rate 08/09/20 1301 (!) 127     Resp 08/09/20 1301 20     Temp 08/09/20 1301 99.3 F (37.4 C)     Temp Source 08/09/20 1301 Oral     SpO2 08/09/20 1301 99 %     Weight --      Height --  Head Circumference --      Peak Flow --      Pain Score 08/09/20 1302 0     Pain Loc --      Pain Edu? --      Excl. in [place]? --    No data found.  Updated Vital Signs BP (!) 140/98 (BP Location: Right Arm)   Pulse (!) 113   Temp 99.3 F (37.4 C) (Oral)   Resp 20   LMP 06/28/2020 (Approximate)   SpO2 99%   Visual Acuity Right Eye Distance:   Left Eye Distance:   Bilateral Distance:    Right Eye Near:   Left Eye Near:    Bilateral Near:     Physical Exam Nursing notes and Vital Signs reviewed. Appearance:  Patient appears stated age, and in no acute distress Eyes:  Pupils are equal, round, and reactive to light and accomodation.  Extraocular movement is intact.  Conjunctivae are not  inflamed.  Fundi benign.  Slight nystagmus to the right.  Ears:  Canals normal.  Tympanic membranes normal.  Nose:   Normal turbinates.  No sinus tenderness.  Pharynx:  Normal Neck:  Supple.  Lungs:  Clear to auscultation.  Breath sounds are equal.  Moving air well. Heart:  Regular rate and rhythm without murmurs, rubs, or gallops.  Abdomen:  Nontender without masses or hepatosplenomegaly.  Bowel sounds are present.  No CVA or flank tenderness.  Extremities:  No edema.  Skin:  No rash present.  Neurologic:  Cranial nerves 2 through 12 are normal.   UC Treatments / Results  Labs (all labs ordered are listed, but only abnormal results are displayed) Labs Reviewed - No data to display  EKG   Radiology No results found.  Procedures Procedures (including critical care time)  Medications Ordered in UC Medications - No data to display  Initial Impression / Assessment and Plan / UC Course  I have reviewed the triage vital signs and the nursing notes.  Pertinent labs & imaging results that were available during my care of the patient were reviewed by me and considered in my medical decision making (see chart for details).    Benign exam.  Suspect early viral syndrome. Rx for Antivert. Followup with Family Doctor if not improved in one week.    Final Clinical Impressions(s) / UC Diagnoses   Final diagnoses:  Benign paroxysmal positional vertigo of right ear     Discharge Instructions     Rest, increase fluid intake.  If symptoms become significantly worse during the night or over the weekend, proceed to the local emergency room.    ED Prescriptions    Medication Sig Dispense Auth. Provider   meclizine (ANTIVERT) 25 MG tablet Take 1 tablet (25 mg total) by mouth 3 (three) times daily as needed for dizziness. 20 tablet Kandra Nicolas, MD        Kandra Nicolas, MD 08/12/20 [phone]

## 2020-08-09 NOTE — Discharge Instructions (Addendum)
Rest, increase fluid intake.   If symptoms become significantly worse during the night or over the weekend, proceed to the local emergency room.  

## 2020-08-09 NOTE — ED Triage Notes (Signed)
Pt c/o dizziness since yesterday morning. Woke up feeling dizzy. Took benedryl yesterday and dramamine with no relief. Hx of anxiety.

## 2022-01-24 IMAGING — DX DG CHEST 2V
2 series · 2 of 2 positions shown · non-contrast
Comparison: None.

CLINICAL DATA: Tachycardia and shortness of breath

EXAM:
CHEST - 2 VIEW

[chest pa]
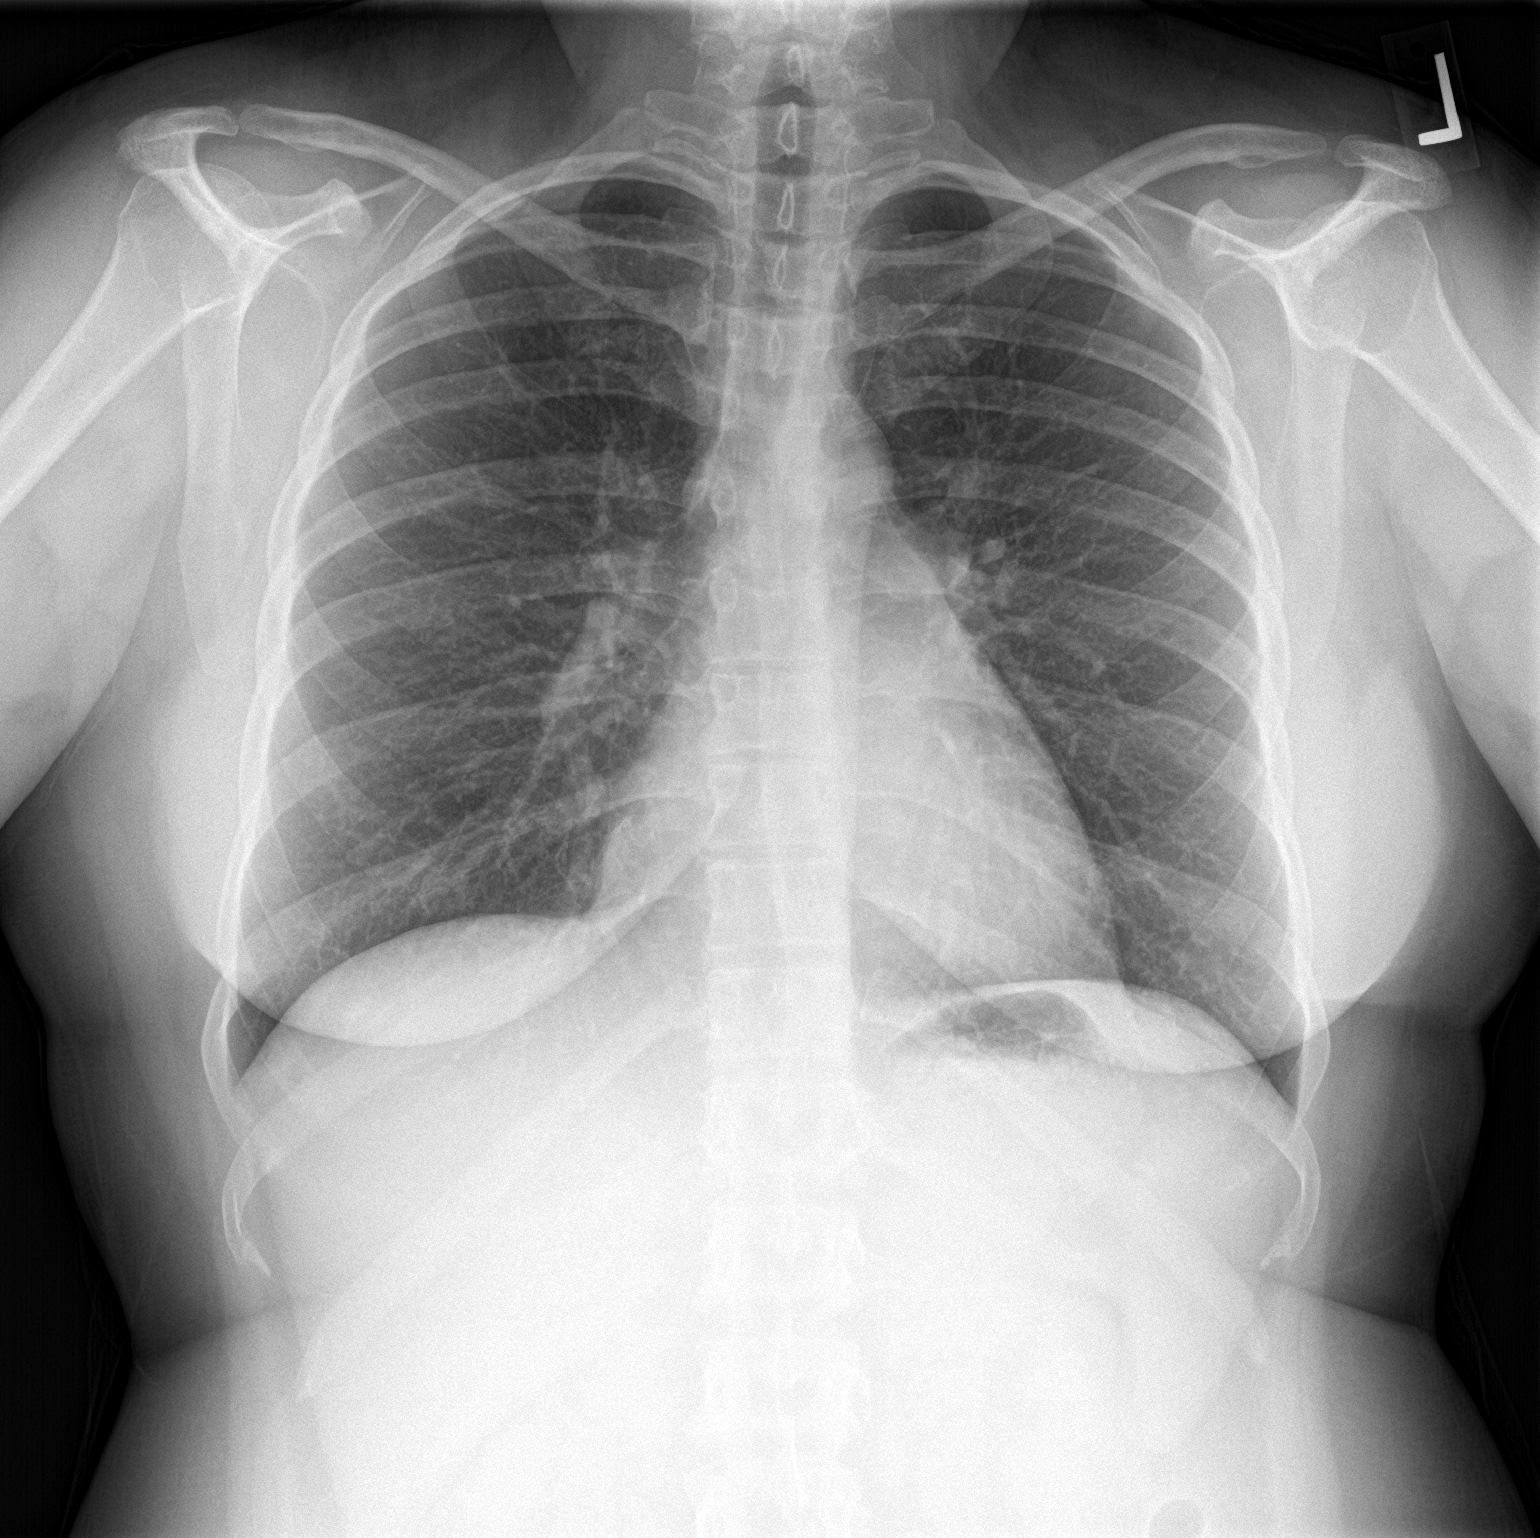

[chest lat]
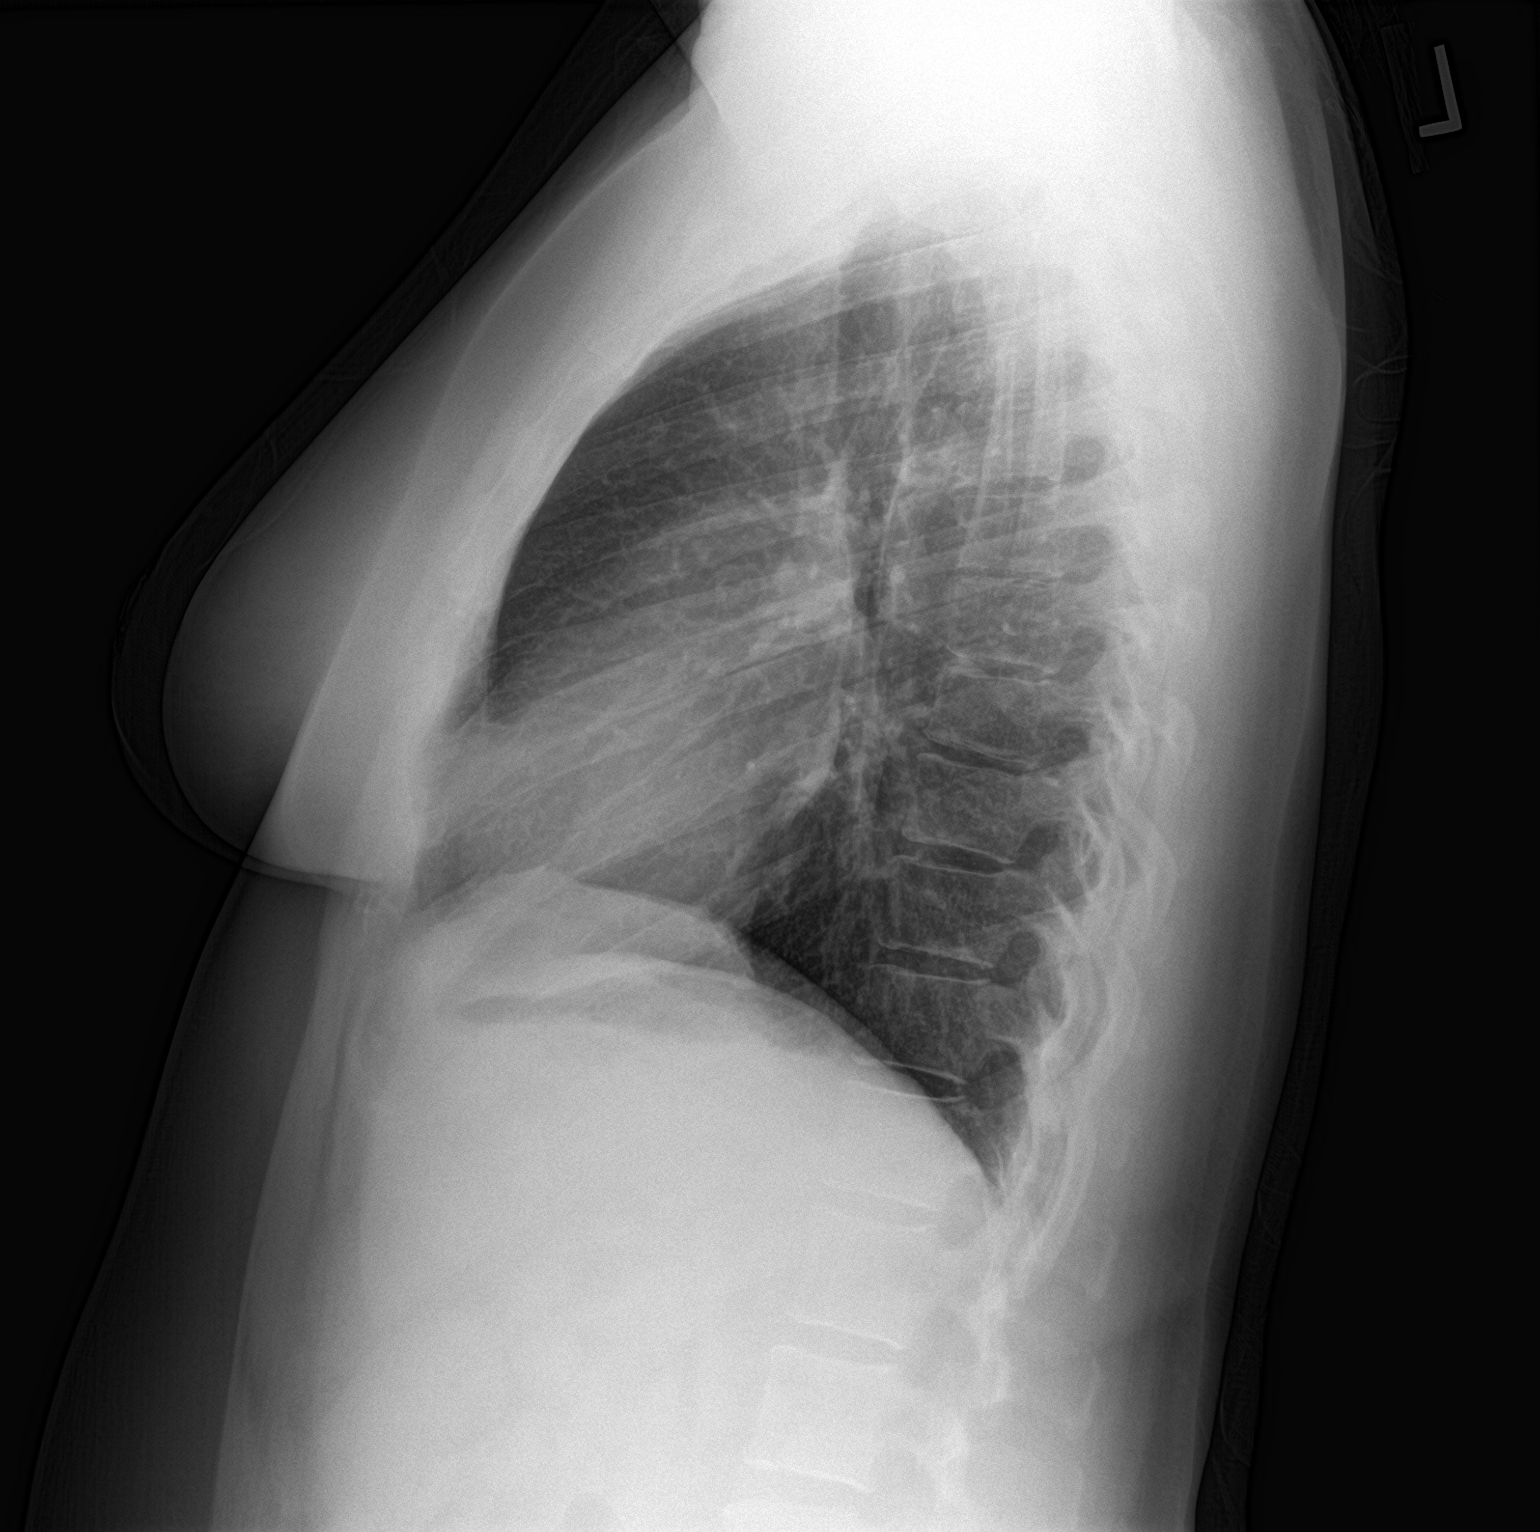

[2 of 2 positions shown; findings below may reference images not displayed]

FINDINGS: Normal heart size and mediastinal contours. No acute infiltrate or
edema. No effusion or pneumothorax. No acute osseous findings.
IMPRESSION: No active cardiopulmonary disease.

## 2022-02-01 IMAGING — DX DG CHEST 1V PORT
1 series · 1 of 1 positions shown · non-contrast
Comparison: Chest radiograph dated 02/10/2020.

CLINICAL DATA: 39-year-old female with tachycardia.

EXAM:
PORTABLE CHEST 1 VIEW

[chest ap]
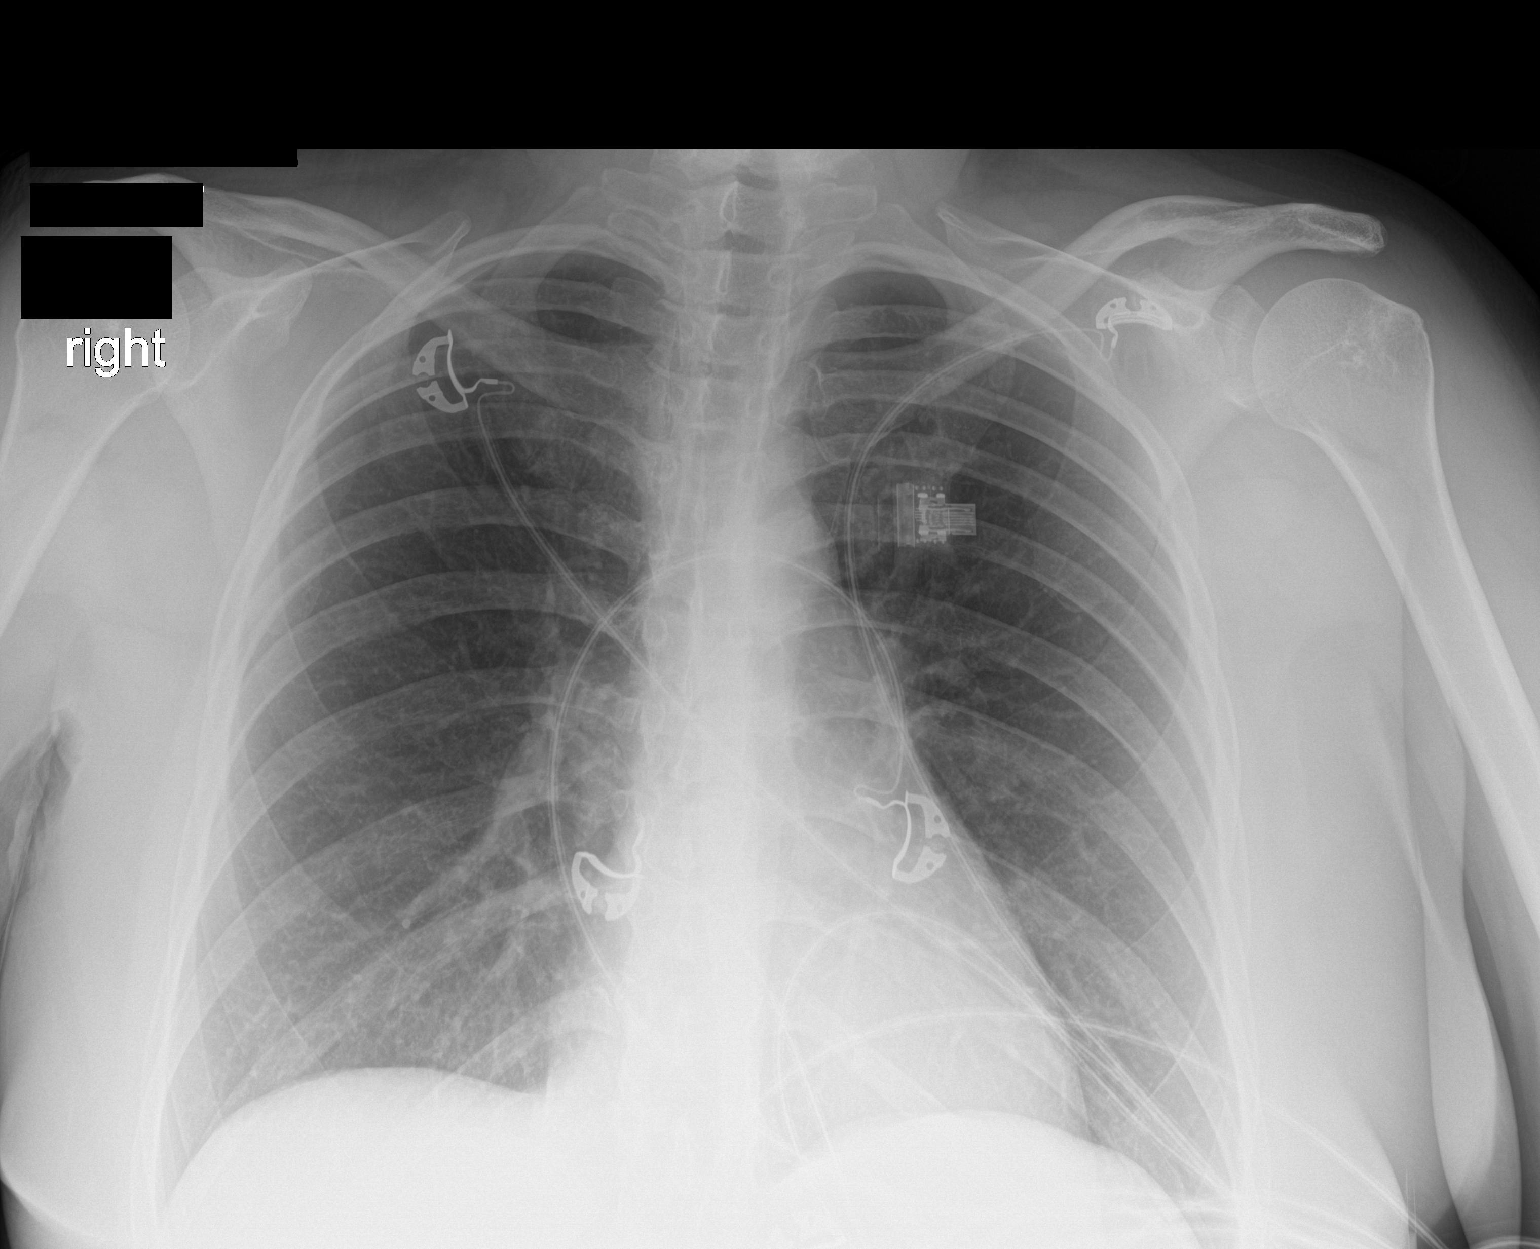

[1 of 1 positions shown; findings below may reference images not displayed]

FINDINGS: The heart size and mediastinal contours are within normal limits.
Both lungs are clear. The visualized skeletal structures are
unremarkable.
IMPRESSION: No active disease.

## 2024-09-13 ENCOUNTER — Other Ambulatory Visit: Payer: Self-pay | Admitting: Medical Genetics

## 2024-12-04 ENCOUNTER — Inpatient Hospital Stay (HOSPITAL_COMMUNITY)
Admission: RE | Admit: 2024-12-04 | Discharge: 2024-12-04 | Payer: Self-pay | Attending: Medical Genetics | Admitting: Medical Genetics

## 2024-12-15 LAB — GENECONNECT MOLECULAR SCREEN: Genetic Analysis Overall Interpretation: NEGATIVE
